# Patient Record
Sex: Male | Born: 2000 | Race: White | Hispanic: No | Marital: Single | Smoking: Never smoker
Health system: Southern US, Community
[De-identification: ages and names within clinical notes are randomized; demographics above are authoritative.]

---
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
## 2019-07-15 ENCOUNTER — Encounter (HOSPITAL_COMMUNITY): Payer: Self-pay

## 2019-07-15 ENCOUNTER — Emergency Department (HOSPITAL_COMMUNITY): Payer: Managed Care, Other (non HMO)

## 2019-07-15 ENCOUNTER — Emergency Department (HOSPITAL_COMMUNITY)
Admission: EM | Admit: 2019-07-15 | Discharge: 2019-07-16 | Disposition: A | Discharge status: Home / Self Care | Payer: Managed Care, Other (non HMO) | Attending: Emergency Medicine | Admitting: Emergency Medicine

## 2019-07-15 ENCOUNTER — Other Ambulatory Visit: Payer: Self-pay

## 2019-07-15 DIAGNOSIS — S4992XA Unspecified injury of left shoulder and upper arm, initial encounter: Secondary | ICD-10-CM | POA: Diagnosis present

## 2019-07-15 DIAGNOSIS — S46912A Strain of unspecified muscle, fascia and tendon at shoulder and upper arm level, left arm, initial encounter: Secondary | ICD-10-CM | POA: Insufficient documentation

## 2019-07-15 DIAGNOSIS — Y999 Unspecified external cause status: Secondary | ICD-10-CM | POA: Diagnosis not present

## 2019-07-15 DIAGNOSIS — S060X1A Concussion with loss of consciousness of 30 minutes or less, initial encounter: Secondary | ICD-10-CM | POA: Diagnosis not present

## 2019-07-15 DIAGNOSIS — M545 Low back pain: Secondary | ICD-10-CM | POA: Insufficient documentation

## 2019-07-15 DIAGNOSIS — Y92410 Unspecified street and highway as the place of occurrence of the external cause: Secondary | ICD-10-CM | POA: Insufficient documentation

## 2019-07-15 DIAGNOSIS — Y93I9 Activity, other involving external motion: Secondary | ICD-10-CM | POA: Diagnosis not present

## 2019-07-15 LAB — COMPREHENSIVE METABOLIC PANEL
ALT: 38 U/L (ref 0–44)
AST: 45 U/L — ABNORMAL HIGH (ref 15–41)
Albumin: 3.8 g/dL (ref 3.5–5.0)
Alkaline Phosphatase: 43 U/L (ref 38–126)
Anion gap: 9 (ref 5–15)
BUN: 10 mg/dL (ref 6–20)
CO2: 23 mmol/L (ref 22–32)
Calcium: 8.1 mg/dL — ABNORMAL LOW (ref 8.9–10.3)
Chloride: 107 mmol/L (ref 98–111)
Creatinine, Ser: 0.86 mg/dL (ref 0.61–1.24)
GFR calc Af Amer: >60 mL/min (ref >60)
GFR calc non Af Amer: >60 mL/min (ref >60)
Glucose, Bld: 113 mg/dL — ABNORMAL HIGH (ref 70–99)
Potassium: 5.3 mmol/L — ABNORMAL HIGH (ref 3.5–5.1)
Sodium: 139 mmol/L (ref 135–145)
Total Bilirubin: 0.9 mg/dL (ref 0.3–1.2)
Total Protein: 6.5 g/dL (ref 6.5–8.1)

## 2019-07-15 LAB — CBC WITH DIFFERENTIAL/PLATELET
Abs Immature Granulocytes: 0.04 10*3/uL (ref 0.00–0.07)
Basophils Absolute: 0 10*3/uL (ref 0.0–0.1)
Basophils Relative: 0 %
Eosinophils Absolute: 0 10*3/uL (ref 0.0–0.5)
Eosinophils Relative: 0 %
HCT: 41.5 % (ref 39.0–52.0)
Hemoglobin: 13.9 g/dL (ref 13.0–17.0)
Immature Granulocytes: 0 %
Lymphocytes Relative: 11 %
Lymphs Abs: 1.2 10*3/uL (ref 0.7–4.0)
MCH: 30.5 pg (ref 26.0–34.0)
MCHC: 33.5 g/dL (ref 30.0–36.0)
MCV: 91 fL (ref 80.0–100.0)
Monocytes Absolute: 0.7 10*3/uL (ref 0.1–1.0)
Monocytes Relative: 7 %
Neutro Abs: 9 10*3/uL — ABNORMAL HIGH (ref 1.7–7.7)
Neutrophils Relative %: 82 %
Platelets: 217 10*3/uL (ref 150–400)
RBC: 4.56 MIL/uL (ref 4.22–5.81)
RDW: 12.6 % (ref 11.5–15.5)
WBC: 11.1 10*3/uL — ABNORMAL HIGH (ref 4.0–10.5)
nRBC: 0 % (ref 0.0–0.2)
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
## 2019-07-16 LAB — URINALYSIS, ROUTINE W REFLEX MICROSCOPIC
Bilirubin Urine: NEGATIVE
Glucose, UA: NEGATIVE mg/dL
Hgb urine dipstick: NEGATIVE
Ketones, ur: NEGATIVE mg/dL
Leukocytes,Ua: NEGATIVE
Nitrite: NEGATIVE
Protein, ur: NEGATIVE mg/dL
Specific Gravity, Urine: 1.009 (ref 1.005–1.030)
pH: 5 (ref 5.0–8.0)
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
## 2019-07-16 NOTE — Discharge Instructions (Addendum)
Apply ice to any sore area as needed.  Take acetaminophen and/or ibuprofen as needed for pain.

## 2020-01-28 ENCOUNTER — Encounter (HOSPITAL_COMMUNITY): Payer: Self-pay

## 2020-01-28 ENCOUNTER — Other Ambulatory Visit: Payer: Self-pay

## 2020-01-28 ENCOUNTER — Emergency Department (HOSPITAL_COMMUNITY)
Admission: EM | Admit: 2020-01-28 | Discharge: 2020-01-29 | Disposition: A | Discharge status: Home / Self Care | Payer: Managed Care, Other (non HMO) | Attending: Emergency Medicine | Admitting: Emergency Medicine

## 2020-01-28 DIAGNOSIS — T1490XA Injury, unspecified, initial encounter: Secondary | ICD-10-CM

## 2020-01-28 DIAGNOSIS — F10920 Alcohol use, unspecified with intoxication, uncomplicated: Secondary | ICD-10-CM

## 2020-01-28 DIAGNOSIS — Y9389 Activity, other specified: Secondary | ICD-10-CM | POA: Insufficient documentation

## 2020-01-28 DIAGNOSIS — R0602 Shortness of breath: Secondary | ICD-10-CM | POA: Insufficient documentation

## 2020-01-28 DIAGNOSIS — Y92410 Unspecified street and highway as the place of occurrence of the external cause: Secondary | ICD-10-CM | POA: Diagnosis not present

## 2020-01-28 DIAGNOSIS — F10129 Alcohol abuse with intoxication, unspecified: Secondary | ICD-10-CM | POA: Insufficient documentation

## 2020-01-28 DIAGNOSIS — R930 Abnormal findings on diagnostic imaging of skull and head, not elsewhere classified: Secondary | ICD-10-CM | POA: Insufficient documentation

## 2020-01-28 DIAGNOSIS — M542 Cervicalgia: Secondary | ICD-10-CM | POA: Diagnosis present

## 2020-01-28 DIAGNOSIS — R935 Abnormal findings on diagnostic imaging of other abdominal regions, including retroperitoneum: Secondary | ICD-10-CM | POA: Diagnosis not present
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
## 2020-01-29 ENCOUNTER — Emergency Department (HOSPITAL_COMMUNITY): Payer: Managed Care, Other (non HMO)

## 2020-01-29 LAB — COMPREHENSIVE METABOLIC PANEL
ALT: 49 U/L — ABNORMAL HIGH (ref 0–44)
AST: 53 U/L — ABNORMAL HIGH (ref 15–41)
Albumin: 4.1 g/dL (ref 3.5–5.0)
Alkaline Phosphatase: 45 U/L (ref 38–126)
Anion gap: 14 (ref 5–15)
BUN: 10 mg/dL (ref 6–20)
CO2: 22 mmol/L (ref 22–32)
Calcium: 8.5 mg/dL — ABNORMAL LOW (ref 8.9–10.3)
Chloride: 98 mmol/L (ref 98–111)
Creatinine, Ser: 0.82 mg/dL (ref 0.61–1.24)
GFR, Estimated: >60 mL/min (ref >60)
Glucose, Bld: 117 mg/dL — ABNORMAL HIGH (ref 70–99)
Potassium: 4.2 mmol/L (ref 3.5–5.1)
Sodium: 134 mmol/L — ABNORMAL LOW (ref 135–145)
Total Bilirubin: 1 mg/dL (ref 0.3–1.2)
Total Protein: 6.8 g/dL (ref 6.5–8.1)

## 2020-01-29 LAB — I-STAT CHEM 8, ED
BUN: 11 mg/dL (ref 6–20)
Calcium, Ion: 1.02 mmol/L — ABNORMAL LOW (ref 1.15–1.40)
Chloride: 99 mmol/L (ref 98–111)
Creatinine, Ser: 1.1 mg/dL (ref 0.61–1.24)
Glucose, Bld: 115 mg/dL — ABNORMAL HIGH (ref 70–99)
HCT: 45 % (ref 39.0–52.0)
Hemoglobin: 15.3 g/dL (ref 13.0–17.0)
Potassium: 4.3 mmol/L (ref 3.5–5.1)
Sodium: 136 mmol/L (ref 135–145)
TCO2: 25 mmol/L (ref 22–32)

## 2020-01-29 LAB — CBC WITH DIFFERENTIAL/PLATELET
Abs Immature Granulocytes: 0.04 10*3/uL (ref 0.00–0.07)
Basophils Absolute: 0 10*3/uL (ref 0.0–0.1)
Basophils Relative: 0 %
Eosinophils Absolute: 0 10*3/uL (ref 0.0–0.5)
Eosinophils Relative: 0 %
HCT: 44.1 % (ref 39.0–52.0)
Hemoglobin: 15.4 g/dL (ref 13.0–17.0)
Immature Granulocytes: 0 %
Lymphocytes Relative: 10 %
Lymphs Abs: 1.1 10*3/uL (ref 0.7–4.0)
MCH: 31.2 pg (ref 26.0–34.0)
MCHC: 34.9 g/dL (ref 30.0–36.0)
MCV: 89.5 fL (ref 80.0–100.0)
Monocytes Absolute: 0.4 10*3/uL (ref 0.1–1.0)
Monocytes Relative: 4 %
Neutro Abs: 8.7 10*3/uL — ABNORMAL HIGH (ref 1.7–7.7)
Neutrophils Relative %: 86 %
Platelets: 227 10*3/uL (ref 150–400)
RBC: 4.93 MIL/uL (ref 4.22–5.81)
RDW: 12.7 % (ref 11.5–15.5)
WBC: 10.2 10*3/uL (ref 4.0–10.5)
nRBC: 0 % (ref 0.0–0.2)

## 2020-01-29 LAB — PROTIME-INR
INR: 1 (ref 0.8–1.2)
Prothrombin Time: 13 seconds (ref 11.4–15.2)

## 2020-01-29 LAB — ETHANOL: Alcohol, Ethyl (B): 245 mg/dL — ABNORMAL HIGH (ref <10)

## 2020-01-29 MED ORDER — CYCLOBENZAPRINE HCL 10 MG PO TABS: 10.0000 mg | ORAL_TABLET | Freq: Three times a day (TID) | ORAL | 0 refills | Status: AC | PRN

## 2020-01-29 MED ORDER — IBUPROFEN 800 MG PO TABS: 800.0000 mg | ORAL_TABLET | Freq: Three times a day (TID) | ORAL | 0 refills | Status: AC

## 2020-01-29 MED ORDER — IOHEXOL 300 MG/ML  SOLN
100.0000 mL | Freq: Once | INTRAMUSCULAR | Status: AC | PRN
  Administered 2020-01-29: 100 mL via INTRAVENOUS

## 2020-01-30 ENCOUNTER — Encounter (HOSPITAL_COMMUNITY): Payer: Self-pay
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
